# Patient Record
Sex: Male | Born: 1995 | Hispanic: Yes | Marital: Single | Smoking: Former smoker
Health system: Southern US, Community
[De-identification: ages and names within clinical notes are randomized; demographics above are authoritative.]

## PROBLEM LIST (undated history)

## (undated) HISTORY — PX: APPENDECTOMY: SHX54

---

## 2015-03-24 ENCOUNTER — Emergency Department (HOSPITAL_COMMUNITY)
Admission: EM | Admit: 2015-03-24 | Discharge: 2015-03-25 | Disposition: A | Discharge status: Home / Self Care | Payer: No Typology Code available for payment source | Attending: Emergency Medicine | Admitting: Emergency Medicine

## 2015-03-24 ENCOUNTER — Encounter (HOSPITAL_COMMUNITY): Payer: Self-pay | Admitting: Emergency Medicine

## 2015-03-24 DIAGNOSIS — S79911A Unspecified injury of right hip, initial encounter: Secondary | ICD-10-CM | POA: Diagnosis not present

## 2015-03-24 DIAGNOSIS — Y92410 Unspecified street and highway as the place of occurrence of the external cause: Secondary | ICD-10-CM | POA: Insufficient documentation

## 2015-03-24 DIAGNOSIS — Y9389 Activity, other specified: Secondary | ICD-10-CM | POA: Insufficient documentation

## 2015-03-24 DIAGNOSIS — S1091XA Abrasion of unspecified part of neck, initial encounter: Secondary | ICD-10-CM | POA: Diagnosis not present

## 2015-03-24 DIAGNOSIS — F10120 Alcohol abuse with intoxication, uncomplicated: Secondary | ICD-10-CM | POA: Insufficient documentation

## 2015-03-24 DIAGNOSIS — Y999 Unspecified external cause status: Secondary | ICD-10-CM | POA: Diagnosis not present

## 2015-03-24 DIAGNOSIS — S4992XA Unspecified injury of left shoulder and upper arm, initial encounter: Secondary | ICD-10-CM | POA: Insufficient documentation

## 2015-03-24 DIAGNOSIS — S3991XA Unspecified injury of abdomen, initial encounter: Secondary | ICD-10-CM | POA: Diagnosis not present

## 2015-03-24 DIAGNOSIS — R000 Tachycardia, unspecified: Secondary | ICD-10-CM | POA: Diagnosis not present

## 2015-03-24 DIAGNOSIS — S199XXA Unspecified injury of neck, initial encounter: Secondary | ICD-10-CM | POA: Diagnosis present

## 2015-03-24 DIAGNOSIS — S299XXA Unspecified injury of thorax, initial encounter: Secondary | ICD-10-CM | POA: Diagnosis not present

## 2015-03-24 DIAGNOSIS — Z87891 Personal history of nicotine dependence: Secondary | ICD-10-CM | POA: Diagnosis not present

## 2015-03-24 DIAGNOSIS — S8991XA Unspecified injury of right lower leg, initial encounter: Secondary | ICD-10-CM | POA: Diagnosis not present

## 2015-03-24 LAB — CBC
HEMATOCRIT: 45.3 % (ref 39.0–52.0)
Hemoglobin: 14.8 g/dL (ref 13.0–17.0)
MCH: 27.2 pg (ref 26.0–34.0)
MCHC: 32.7 g/dL (ref 30.0–36.0)
MCV: 83.1 fL (ref 78.0–100.0)
PLATELETS: 256 10*3/uL (ref 150–400)
RBC: 5.45 MIL/uL (ref 4.22–5.81)
RDW: 13.2 % (ref 11.5–15.5)
WBC: 10.2 10*3/uL (ref 4.0–10.5)

## 2015-03-24 LAB — I-STAT CG4 LACTIC ACID, ED: LACTIC ACID, VENOUS: 2.23 mmol/L — AB (ref 0.5–2.0)

## 2015-03-24 MED ORDER — SODIUM CHLORIDE 0.9 % IV BOLUS (SEPSIS)
125.0000 mL | Freq: Once | INTRAVENOUS | Status: AC
  Administered 2015-03-24: 125 mL via INTRAVENOUS

## 2015-03-24 MED ORDER — HYDROMORPHONE HCL 1 MG/ML IJ SOLN
1.0000 mg | Freq: Once | INTRAMUSCULAR | Status: AC
  Administered 2015-03-24: 1 mg via INTRAVENOUS
  Filled 2015-03-24: qty 1
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
## 2015-03-24 NOTE — ED Notes (Signed)
Dr Norlene Campbelltter given a copy of lactic acid results 2.23

## 2015-03-25 ENCOUNTER — Encounter (HOSPITAL_COMMUNITY): Payer: Self-pay | Admitting: Radiology

## 2015-03-25 ENCOUNTER — Emergency Department (HOSPITAL_COMMUNITY): Payer: No Typology Code available for payment source

## 2015-03-25 LAB — URINALYSIS, ROUTINE W REFLEX MICROSCOPIC
BILIRUBIN URINE: NEGATIVE
GLUCOSE, UA: NEGATIVE mg/dL
HGB URINE DIPSTICK: NEGATIVE
Ketones, ur: NEGATIVE mg/dL
Leukocytes, UA: NEGATIVE
Nitrite: NEGATIVE
PH: 5.5 (ref 5.0–8.0)
Protein, ur: NEGATIVE mg/dL
Specific Gravity, Urine: 1.007 (ref 1.005–1.030)
Urobilinogen, UA: 1 mg/dL (ref 0.0–1.0)

## 2015-03-25 LAB — COMPREHENSIVE METABOLIC PANEL
ALT: 18 U/L (ref 17–63)
AST: 21 U/L (ref 15–41)
Albumin: 4.3 g/dL (ref 3.5–5.0)
Alkaline Phosphatase: 62 U/L (ref 38–126)
Anion gap: 12 (ref 5–15)
BILIRUBIN TOTAL: 0.7 mg/dL (ref 0.3–1.2)
BUN: 7 mg/dL (ref 6–20)
CALCIUM: 8.6 mg/dL — AB (ref 8.9–10.3)
CO2: 20 mmol/L — AB (ref 22–32)
CREATININE: 0.81 mg/dL (ref 0.61–1.24)
Chloride: 109 mmol/L (ref 101–111)
GLUCOSE: 108 mg/dL — AB (ref 65–99)
Potassium: 3.5 mmol/L (ref 3.5–5.1)
Sodium: 141 mmol/L (ref 135–145)
Total Protein: 6.9 g/dL (ref 6.5–8.1)

## 2015-03-25 LAB — ETHANOL: Alcohol, Ethyl (B): 130 mg/dL — ABNORMAL HIGH (ref <5)

## 2015-03-25 MED ORDER — SODIUM CHLORIDE 0.9 % IV BOLUS (SEPSIS)
1000.0000 mL | Freq: Once | INTRAVENOUS | Status: AC
  Administered 2015-03-25: 1000 mL via INTRAVENOUS

## 2015-03-25 MED ORDER — IOHEXOL 300 MG/ML  SOLN
100.0000 mL | Freq: Once | INTRAMUSCULAR | Status: AC | PRN
  Administered 2015-03-25: 100 mL via INTRAVENOUS

## 2015-03-25 MED ORDER — TRAMADOL HCL 50 MG PO TABS: 50.0000 mg | ORAL_TABLET | Freq: Four times a day (QID) | ORAL | Status: DC | PRN
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
## 2015-03-25 NOTE — ED Notes (Signed)
Pt ambulated on the hallway with steady gait, no c/o pain, no dizziness.
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
## 2020-07-21 ENCOUNTER — Encounter (HOSPITAL_BASED_OUTPATIENT_CLINIC_OR_DEPARTMENT_OTHER): Payer: Self-pay | Admitting: *Deleted

## 2020-07-21 ENCOUNTER — Emergency Department (HOSPITAL_BASED_OUTPATIENT_CLINIC_OR_DEPARTMENT_OTHER)
Admission: EM | Admit: 2020-07-21 | Discharge: 2020-07-21 | Disposition: A | Discharge status: Home / Self Care | Payer: Self-pay | Attending: Emergency Medicine | Admitting: Emergency Medicine

## 2020-07-21 ENCOUNTER — Other Ambulatory Visit: Payer: Self-pay

## 2020-07-21 DIAGNOSIS — L600 Ingrowing nail: Secondary | ICD-10-CM | POA: Insufficient documentation

## 2020-07-21 DIAGNOSIS — Z87891 Personal history of nicotine dependence: Secondary | ICD-10-CM | POA: Insufficient documentation

## 2020-07-21 MED ORDER — LIDOCAINE HCL 2 % IJ SOLN: 10.0000 mL | Freq: Once | INTRAMUSCULAR | Status: AC

## 2020-07-21 MED ORDER — LIDOCAINE HCL 2 % IJ SOLN
INTRAMUSCULAR | Status: AC
  Administered 2020-07-21: 200 mg
  Filled 2020-07-21: qty 20

## 2020-07-21 MED ORDER — NAPROXEN 250 MG PO TABS
500.0000 mg | ORAL_TABLET | Freq: Once | ORAL | Status: AC
  Administered 2020-07-21: 500 mg via ORAL
  Filled 2020-07-21: qty 2

## 2020-07-21 NOTE — ED Triage Notes (Addendum)
C/o hitting his right great toe 2 weeks ago on the bed. Concerned about an infection. Denies any fevers. Has not taken anything for pain. On exam there is no obvious infection or swelling. Pt speaks no english(Spanish)  Triage obtained by language line.

## 2020-07-21 NOTE — ED Notes (Signed)
ED Provider at bedside. 

## 2020-07-21 NOTE — ED Provider Notes (Signed)
[facility] Provider Note: Lowella Dell, MD, FACEP  CSN: [id number] MRN: [id number] ARRIVAL: 07/21/20 at 0307 ROOM: [facility]   CHIEF COMPLAINT  Toe Injury   HISTORY OF PRESENT ILLNESS  07/21/20 4:16 AM Alex Forbes is a 24 y.o. male who stubbed his right great toe on a bed 2 weeks ago.  He has had persistent pain and swelling on the lateral aspect of the nail.  He rates this pain as a 5 out of 10, worse with walking.  There has been no purulent drainage.  He is concerned he may have an infection.   History reviewed. No pertinent past medical history.  Past Surgical History:  Procedure Laterality Date  . APPENDECTOMY      No family history on file.  Social History   Tobacco Use  . Smoking status: Former Games developer  . Smokeless tobacco: Never Used  Substance Use Topics  . Alcohol use: Yes    Alcohol/week: 3.0 standard drinks    Types: 3 Cans of beer per week    Comment: ocassionally  . Drug use: No    Prior to Admission medications   Not on File    Allergies Patient has no known allergies.   REVIEW OF SYSTEMS  Negative except as noted here or in the History of Present Illness.   PHYSICAL EXAMINATION  Initial Vital Signs Blood pressure 112/71, pulse 96, temperature 98.4 F (36.9 C), temperature source Oral, resp. rate 16, weight 68 kg, SpO2 99 %.  Examination General: Well-developed, well-nourished male in no acute distress; appearance consistent with age of record HENT: normocephalic; atraumatic Eyes: Normal appearance Neck: supple Heart: regular rate and rhythm Lungs: clear to auscultation bilaterally Abdomen: soft; nondistended; nontender; bowel sounds present Extremities: No deformity; full range of motion; pulses normal Neurologic: Awake, alert; motor function intact in all extremities and symmetric; no facial droop Skin: Warm and dry; tenderness and swelling adjacent to lateral aspect of great toenail, which is  ingrown:    Psychiatric: Normal mood and affect   RESULTS  Summary of this visit's results, reviewed and interpreted by myself:   EKG Interpretation  Date/Time:    Ventricular Rate:    PR Interval:    QRS Duration:   QT Interval:    QTC Calculation:   R Axis:     Text Interpretation:        Laboratory Studies: No results found for this or any previous visit (from the past 24 hour(s)). Imaging Studies: No results found.  ED COURSE and MDM  Nursing notes, initial and subsequent vitals signs, including pulse oximetry, reviewed and interpreted by myself.  Vitals:   07/21/20 0351 07/21/20 0356  BP:  112/71  Pulse:  96  Resp:  16  Temp:  98.4 F (36.9 C)  TempSrc:  Oral  SpO2:  99%  Weight: 68 kg    Medications  lidocaine (XYLOCAINE) 2 % (with pres) injection 200 mg (has no administration in time range)  lidocaine (XYLOCAINE) 2 % (with pres) injection (has no administration in time range)  naproxen (NAPROSYN) tablet 500 mg (has no administration in time range)      PROCEDURES  Excise ingrown toenail  Date/Time: 07/21/2020 4:38 AM Performed by: Khyle Goodell, MD Authorized by: Jaymes Hang, MD  Preparation: Patient was prepped and draped in the usual sterile fashion. Local anesthesia used: yes Anesthesia method: Hemidigital block.  Anesthesia: Local anesthesia used: yes Local Anesthetic: lidocaine 2% without epinephrine Anesthetic total: 3 mL  Sedation: Patient sedated:  no  Patient tolerance: patient tolerated the procedure well with no immediate complications Comments: Wedge of lateral aspect of nail removed.    ED DIAGNOSES     ICD-10-CM   1. Ingrown right big toenail  L60.0        Marelin Tat, Jonny Ruiz, MD 07/21/20 [phone]
